# Patient Record
Sex: Female | Born: 1935 | Hispanic: Yes | Smoking: Never smoker
Health system: Southern US, Community
[De-identification: ages and names within clinical notes are randomized; demographics above are authoritative.]

## PROBLEM LIST (undated history)

## (undated) DIAGNOSIS — F0390 Unspecified dementia without behavioral disturbance: Secondary | ICD-10-CM

## (undated) DIAGNOSIS — E119 Type 2 diabetes mellitus without complications: Secondary | ICD-10-CM

## (undated) DIAGNOSIS — E785 Hyperlipidemia, unspecified: Secondary | ICD-10-CM

## (undated) DIAGNOSIS — I639 Cerebral infarction, unspecified: Secondary | ICD-10-CM

## (undated) HISTORY — PX: EYE SURGERY: SHX253

## (undated) HISTORY — PX: CHOLECYSTECTOMY: SHX55

## (undated) HISTORY — PX: ABDOMINAL HYSTERECTOMY: SHX81

---

## 2015-11-23 ENCOUNTER — Other Ambulatory Visit: Payer: Self-pay | Admitting: Specialist

## 2015-11-23 ENCOUNTER — Other Ambulatory Visit (HOSPITAL_COMMUNITY): Payer: Self-pay | Admitting: Specialist

## 2015-11-23 DIAGNOSIS — R519 Headache, unspecified: Secondary | ICD-10-CM

## 2015-11-23 DIAGNOSIS — R42 Dizziness and giddiness: Secondary | ICD-10-CM

## 2015-11-23 DIAGNOSIS — R51 Headache: Secondary | ICD-10-CM

## 2015-11-23 DIAGNOSIS — R413 Other amnesia: Secondary | ICD-10-CM

## 2015-11-29 ENCOUNTER — Encounter (INDEPENDENT_AMBULATORY_CARE_PROVIDER_SITE_OTHER): Payer: Self-pay

## 2015-11-29 ENCOUNTER — Ambulatory Visit
Admission: RE | Admit: 2015-11-29 | Discharge: 2015-11-29 | Disposition: A | Discharge status: Home / Self Care | Payer: Medicare HMO | Source: Ambulatory Visit | Attending: Specialist | Admitting: Specialist

## 2015-11-29 DIAGNOSIS — R413 Other amnesia: Secondary | ICD-10-CM | POA: Insufficient documentation

## 2015-11-29 DIAGNOSIS — R42 Dizziness and giddiness: Secondary | ICD-10-CM | POA: Diagnosis not present

## 2015-11-29 DIAGNOSIS — R519 Headache, unspecified: Secondary | ICD-10-CM

## 2015-11-29 DIAGNOSIS — R51 Headache: Secondary | ICD-10-CM | POA: Diagnosis not present

## 2015-11-29 DIAGNOSIS — G319 Degenerative disease of nervous system, unspecified: Secondary | ICD-10-CM | POA: Insufficient documentation

## 2016-01-27 ENCOUNTER — Encounter: Payer: Self-pay | Admitting: Emergency Medicine

## 2016-01-27 ENCOUNTER — Emergency Department: Payer: Medicare HMO

## 2016-01-27 ENCOUNTER — Emergency Department
Admission: EM | Admit: 2016-01-27 | Discharge: 2016-01-27 | Disposition: A | Discharge status: Home / Self Care | Payer: Medicare HMO | Attending: Student in an Organized Health Care Education/Training Program | Admitting: Student in an Organized Health Care Education/Training Program

## 2016-01-27 DIAGNOSIS — E11649 Type 2 diabetes mellitus with hypoglycemia without coma: Secondary | ICD-10-CM | POA: Diagnosis present

## 2016-01-27 DIAGNOSIS — E162 Hypoglycemia, unspecified: Secondary | ICD-10-CM

## 2016-01-27 DIAGNOSIS — Z79899 Other long term (current) drug therapy: Secondary | ICD-10-CM | POA: Insufficient documentation

## 2016-01-27 DIAGNOSIS — Z7982 Long term (current) use of aspirin: Secondary | ICD-10-CM | POA: Diagnosis not present

## 2016-01-27 DIAGNOSIS — R4182 Altered mental status, unspecified: Secondary | ICD-10-CM | POA: Diagnosis not present

## 2016-01-27 HISTORY — DX: Type 2 diabetes mellitus without complications: E11.9

## 2016-01-27 HISTORY — DX: Hyperlipidemia, unspecified: E78.5

## 2016-01-27 HISTORY — DX: Unspecified dementia, unspecified severity, without behavioral disturbance, psychotic disturbance, mood disturbance, and anxiety: F03.90

## 2016-01-27 LAB — CBC WITH DIFFERENTIAL/PLATELET
BASOS ABS: 0 10*3/uL (ref 0–0.1)
Basophils Relative: 0 %
EOS PCT: 0 %
Eosinophils Absolute: 0 10*3/uL (ref 0–0.7)
HCT: 36.7 % (ref 35.0–47.0)
Hemoglobin: 11.9 g/dL — ABNORMAL LOW (ref 12.0–16.0)
LYMPHS PCT: 18 %
Lymphs Abs: 1.5 10*3/uL (ref 1.0–3.6)
MCH: 26.5 pg (ref 26.0–34.0)
MCHC: 32.5 g/dL (ref 32.0–36.0)
MCV: 81.8 fL (ref 80.0–100.0)
MONO ABS: 0.4 10*3/uL (ref 0.2–0.9)
Monocytes Relative: 5 %
Neutro Abs: 6.4 10*3/uL (ref 1.4–6.5)
Neutrophils Relative %: 77 %
PLATELETS: 207 10*3/uL (ref 150–440)
RBC: 4.48 MIL/uL (ref 3.80–5.20)
RDW: 14.2 % (ref 11.5–14.5)
WBC: 8.3 10*3/uL (ref 3.6–11.0)

## 2016-01-27 LAB — BASIC METABOLIC PANEL
ANION GAP: 10 (ref 5–15)
BUN: 26 mg/dL — ABNORMAL HIGH (ref 6–20)
CALCIUM: 9.1 mg/dL (ref 8.9–10.3)
CO2: 27 mmol/L (ref 22–32)
Chloride: 102 mmol/L (ref 101–111)
Creatinine, Ser: 0.82 mg/dL (ref 0.44–1.00)
Glucose, Bld: 45 mg/dL — ABNORMAL LOW (ref 65–99)
POTASSIUM: 3.6 mmol/L (ref 3.5–5.1)
SODIUM: 139 mmol/L (ref 135–145)

## 2016-01-27 LAB — GLUCOSE, CAPILLARY
GLUCOSE-CAPILLARY: 30 mg/dL — AB (ref 65–99)
GLUCOSE-CAPILLARY: 116 mg/dL — AB (ref 65–99)
Glucose-Capillary: 122 mg/dL — ABNORMAL HIGH (ref 65–99)
Glucose-Capillary: 164 mg/dL — ABNORMAL HIGH (ref 65–99)

## 2016-01-27 LAB — URINALYSIS, COMPLETE (UACMP) WITH MICROSCOPIC
BILIRUBIN URINE: NEGATIVE
Bacteria, UA: NONE SEEN
HGB URINE DIPSTICK: NEGATIVE
Ketones, ur: NEGATIVE mg/dL
Leukocytes, UA: NEGATIVE
Nitrite: NEGATIVE
PH: 5 (ref 5.0–8.0)
PROTEIN: NEGATIVE mg/dL
Specific Gravity, Urine: 1.011 (ref 1.005–1.030)

## 2016-01-27 MED ORDER — DEXTROSE 50 % IV SOLN
1.0000 | Freq: Once | INTRAVENOUS | Status: AC
  Administered 2016-01-27: 50 mL via INTRAVENOUS

## 2016-01-27 NOTE — ED Provider Notes (Signed)
[facility] Emergency Department Provider Note    First MD Initiated Contact with Patient 01/27/16 1440     (approximate)  I have reviewed the triage vital signs and the nursing notes.   HISTORY  Chief Complaint Hypoglycemia    HPI Eileen Mahoney is a 83 y.o. female the history of dementia as well as diabetes on long-acting insulin presents with altered mental status.  Family states that she had a normal appetite. They recently increased her insulin from 20 in the morning and 13 at night to 24 in the morning and 13 at night. They made this change 2 days ago. Denies any other symptoms or past 2 days. States that there were being evaluated today and noted that the patient became very confused had facial droop and was stumbling from side to side. She did not fully lose consciousness. Did not hit her head. They brought her to the ER where her second glucose showed that it was 30. She was given D50 and oral glucose with improvement in her symptoms. Family states that they have never noticed these deficits before. Does have a history of vascular dementia.    Past Medical History:  Diagnosis Date  . Dementia   . Diabetes mellitus without complication (HCC)   . Hyperlipidemia    No family history on file. Past Surgical History:  Procedure Laterality Date  . ABDOMINAL HYSTERECTOMY    . CHOLECYSTECTOMY    . EYE SURGERY     There are no active problems to display for this patient.     Prior to Admission medications   Not on File    Allergies Patient has no known allergies.    Social History Social History  Substance Use Topics  . Smoking status: Never Smoker  . Smokeless tobacco: Never Used  . Alcohol use No    Review of Systems Patient denies headaches, rhinorrhea, blurry vision, numbness, shortness of breath, chest pain, edema, cough, abdominal pain, nausea, vomiting, diarrhea, dysuria, fevers, rashes or hallucinations unless otherwise  stated above in HPI. ____________________________________________   PHYSICAL EXAM:  VITAL SIGNS: Vitals:   01/27/16 1440 01/27/16 1500  BP: (!) 144/64 (!) 154/62  Pulse: 75 75  Resp: 16 13  Temp: 97.9 F (36.6 C)     Constitutional: Alert and oriented. pleasant appearing and in no acute distress. Eyes: Conjunctivae are normal. PERRL. EOMI. Head: Atraumatic. Nose: No congestion/rhinnorhea. Mouth/Throat: Mucous membranes are moist.  Oropharynx non-erythematous. Neck: No stridor. Painless ROM. No cervical spine tenderness to palpation Hematological/Lymphatic/Immunilogical: No cervical lymphadenopathy. Cardiovascular: Normal rate, regular rhythm. Grossly normal heart sounds.  Good peripheral circulation. Respiratory: Normal respiratory effort.  No retractions. Lungs CTAB. Gastrointestinal: Soft and nontender. No distention. No abdominal bruits. No CVA tenderness. Musculoskeletal: No lower extremity tenderness nor edema.  No joint effusions. Neurologic:  CN- intact.  No facial droop, Normal FNF.  Normal heel to shin.  Sensation intact bilaterally. Normal speech and language. No gross focal neurologic deficits are appreciated. No gait instability.  Skin:  Skin is warm, dry and intact. No rash noted. Psychiatric: Mood and affect are normal. Speech and behavior are normal.  ____________________________________________   LABS (all labs ordered are listed, but only abnormal results are displayed)  Results for orders placed or performed during the hospital encounter of 01/27/16 (from the past 24 hour(s))  Glucose, capillary     Status: Abnormal   Collection Time: 01/27/16  2:24 PM  Result Value Ref Range   Glucose-Capillary 30 (LL)  65 - 99 mg/dL   Comment 1 Notify RN   CBC with Differential     Status: Abnormal   Collection Time: 01/27/16  2:38 PM  Result Value Ref Range   WBC 8.3 3.6 - 11.0 K/uL   RBC 4.48 3.80 - 5.20 MIL/uL   Hemoglobin 11.9 (L) 12.0 - 16.0 g/dL   HCT 19.136.7  47.835.0 - 29.547.0 %   MCV 81.8 80.0 - 100.0 fL   MCH 26.5 26.0 - 34.0 pg   MCHC 32.5 32.0 - 36.0 g/dL   RDW 62.114.2 30.811.5 - 65.714.5 %   Platelets 207 150 - 440 K/uL   Neutrophils Relative % 77 %   Neutro Abs 6.4 1.4 - 6.5 K/uL   Lymphocytes Relative 18 %   Lymphs Abs 1.5 1.0 - 3.6 K/uL   Monocytes Relative 5 %   Monocytes Absolute 0.4 0.2 - 0.9 K/uL   Eosinophils Relative 0 %   Eosinophils Absolute 0.0 0 - 0.7 K/uL   Basophils Relative 0 %   Basophils Absolute 0.0 0 - 0.1 K/uL  Basic metabolic panel     Status: Abnormal   Collection Time: 01/27/16  2:38 PM  Result Value Ref Range   Sodium 139 135 - 145 mmol/L   Potassium 3.6 3.5 - 5.1 mmol/L   Chloride 102 101 - 111 mmol/L   CO2 27 22 - 32 mmol/L   Glucose, Bld 45 (L) 65 - 99 mg/dL   BUN 26 (H) 6 - 20 mg/dL   Creatinine, Ser 8.460.82 0.44 - 1.00 mg/dL   Calcium 9.1 8.9 - 96.210.3 mg/dL   GFR calc non Af Amer >60 >60 mL/min   GFR calc Af Amer >60 >60 mL/min   Anion gap 10 5 - 15  Glucose, capillary     Status: Abnormal   Collection Time: 01/27/16  2:45 PM  Result Value Ref Range   Glucose-Capillary 122 (H) 65 - 99 mg/dL  Glucose, capillary     Status: Abnormal   Collection Time: 01/27/16  3:48 PM  Result Value Ref Range   Glucose-Capillary 116 (H) 65 - 99 mg/dL  Urinalysis, Complete w Microscopic     Status: Abnormal   Collection Time: 01/27/16  5:06 PM  Result Value Ref Range   Color, Urine YELLOW (A) YELLOW   APPearance CLEAR (A) CLEAR   Specific Gravity, Urine 1.011 1.005 - 1.030   pH 5.0 5.0 - 8.0   Glucose, UA >=500 (A) NEGATIVE mg/dL   Hgb urine dipstick NEGATIVE NEGATIVE   Bilirubin Urine NEGATIVE NEGATIVE   Ketones, ur NEGATIVE NEGATIVE mg/dL   Protein, ur NEGATIVE NEGATIVE mg/dL   Nitrite NEGATIVE NEGATIVE   Leukocytes, UA NEGATIVE NEGATIVE   RBC / HPF 0-5 0 - 5 RBC/hpf   WBC, UA 0-5 0 - 5 WBC/hpf   Bacteria, UA NONE SEEN NONE SEEN   Squamous Epithelial / LPF 0-5 (A) NONE SEEN   Mucous PRESENT   Glucose, capillary      Status: Abnormal   Collection Time: 01/27/16  5:30 PM  Result Value Ref Range   Glucose-Capillary 164 (H) 65 - 99 mg/dL   ____________________________________________  EKG My review and personal interpretation at Time: 14:39   Indication: ams  Rate: 80  Rhythm: sinus Axis: normal Other: limited due arm lead reversal, no ST elevation or depression, normal intervals ____________________________________________  RADIOLOGY  I personally reviewed all radiographic images ordered to evaluate for the above acute complaints and reviewed radiology reports and findings.  These findings were personally discussed with the patient.  Please see medical record for radiology report.  ____________________________________________   PROCEDURES  Procedure(s) performed:  Procedures    Critical Care performed: no ____________________________________________   INITIAL IMPRESSION / ASSESSMENT AND PLAN / ED COURSE  Pertinent labs & imaging results that were available during my care of the patient were reviewed by me and considered in my medical decision making (see chart for details).  DDX: Dehydration, hypoglycemia,  sepsis, pna, uti, hypoglycemia, cva, drug effect, withdrawal, encephalitis   Eileen Mahoney is a 83 y.o. who presents to the ED with altered mental status droop and difficulty ambulating with acute hypoglycemia. Patient afebrile and otherwise hemodynamically stable. Symptoms resolved after D50 and now tolerating oral glucose. Do suspect this is related to recent increase in her NovoLog. Patient H however we will order laboratory assessment evaluate for any underlying infection or renal impairment that could be leading to prolonged insulin effect. Also order CT imaging of the head due to new deficits to evaluate for any underlying lesions or CVA.  Clinical Course as of Jan 28 24  Thu Jan 27, 2016  1633 Informed by the patient's family that her insulin was given at 8:00.  Therefore would expect the insulin to warn off at this point. She remains in no acute distress.  [PR]  1633 CT head with chronic changes. No evidence of acute mass or lesion.  [PR]  1748 Repeat exam with patient well-appearing and in no acute distress.  Patient is acting at baseline. She has passed her half-life on insulin. I discussed patient needing to return to her previous insulin dosing and to follow-up with primary care physician. There is no evidence of infection. Discussed CT imaging with patient.  Patient was able to tolerate PO and was able to ambulate with a steady gait.  Have discussed with the patient and available family all diagnostics and treatments performed thus far and all questions were answered to the best of my ability. The patient demonstrates understanding and agreement with plan.   [PR]    Clinical Course User Index [PR] Willy Eddy, MD     ____________________________________________   FINAL CLINICAL IMPRESSION(S) / ED DIAGNOSES  Final diagnoses:  Hypoglycemia      NEW MEDICATIONS STARTED DURING THIS VISIT:  New Prescriptions   No medications on file     Note:  This document was prepared using Dragon voice recognition software and may include unintentional dictation errors.    Willy Eddy, MD 01/28/16 Eileen Mahoney

## 2016-01-27 NOTE — ED Notes (Signed)
Patient drank 8oz of OJ 

## 2016-01-27 NOTE — Discharge Instructions (Signed)
Regresa a 24u en la manana y 13u en la noche.

## 2016-01-27 NOTE — ED Triage Notes (Signed)
Interpreter being used. Spanish speaking. Patient presents to ED via POV due to family noticing confusion. CBG 30 in triage. 1 episode of vomiting in triage. Patient brought back to room 26. MD notified. D50 given. Patient alert entire duration. Hx of diabetes.

## 2016-01-27 NOTE — ED Notes (Signed)
Patient given peanut butter, graham crackers and 8 oz of OJ. Family instructed to encourage patient to eat/drink. Verbalize understanding.

## 2016-11-02 ENCOUNTER — Emergency Department: Payer: Medicare HMO

## 2016-11-02 ENCOUNTER — Emergency Department
Admission: EM | Admit: 2016-11-02 | Discharge: 2016-11-02 | Disposition: A | Discharge status: Home / Self Care | Payer: Medicare HMO | Attending: Emergency Medicine | Admitting: Emergency Medicine

## 2016-11-02 DIAGNOSIS — Z794 Long term (current) use of insulin: Secondary | ICD-10-CM | POA: Insufficient documentation

## 2016-11-02 DIAGNOSIS — G4489 Other headache syndrome: Secondary | ICD-10-CM | POA: Insufficient documentation

## 2016-11-02 DIAGNOSIS — E119 Type 2 diabetes mellitus without complications: Secondary | ICD-10-CM | POA: Insufficient documentation

## 2016-11-02 DIAGNOSIS — Z8673 Personal history of transient ischemic attack (TIA), and cerebral infarction without residual deficits: Secondary | ICD-10-CM | POA: Insufficient documentation

## 2016-11-02 DIAGNOSIS — F0390 Unspecified dementia without behavioral disturbance: Secondary | ICD-10-CM | POA: Diagnosis not present

## 2016-11-02 DIAGNOSIS — Z7982 Long term (current) use of aspirin: Secondary | ICD-10-CM | POA: Insufficient documentation

## 2016-11-02 DIAGNOSIS — Z79899 Other long term (current) drug therapy: Secondary | ICD-10-CM | POA: Diagnosis not present

## 2016-11-02 DIAGNOSIS — R51 Headache: Secondary | ICD-10-CM | POA: Diagnosis present

## 2016-11-02 HISTORY — DX: Cerebral infarction, unspecified: I63.9

## 2016-11-02 LAB — CBC
HCT: 42.6 % (ref 35.0–47.0)
Hemoglobin: 14 g/dL (ref 12.0–16.0)
MCH: 27 pg (ref 26.0–34.0)
MCHC: 32.9 g/dL (ref 32.0–36.0)
MCV: 82.3 fL (ref 80.0–100.0)
PLATELETS: 168 10*3/uL (ref 150–440)
RBC: 5.17 MIL/uL (ref 3.80–5.20)
RDW: 14.3 % (ref 11.5–14.5)
WBC: 6.8 10*3/uL (ref 3.6–11.0)

## 2016-11-02 LAB — BASIC METABOLIC PANEL
ANION GAP: 9 (ref 5–15)
BUN: 18 mg/dL (ref 6–20)
CALCIUM: 9.2 mg/dL (ref 8.9–10.3)
CO2: 28 mmol/L (ref 22–32)
CREATININE: 0.97 mg/dL (ref 0.44–1.00)
Chloride: 103 mmol/L (ref 101–111)
GFR calc Af Amer: >60 mL/min (ref >60)
GFR, EST NON AFRICAN AMERICAN: 52 mL/min — AB (ref >60)
GLUCOSE: 155 mg/dL — AB (ref 65–99)
Potassium: 4.3 mmol/L (ref 3.5–5.1)
Sodium: 140 mmol/L (ref 135–145)

## 2016-11-02 LAB — HEPATIC FUNCTION PANEL
ALBUMIN: 4.1 g/dL (ref 3.5–5.0)
ALT: 18 U/L (ref 14–54)
AST: 35 U/L (ref 15–41)
Alkaline Phosphatase: 68 U/L (ref 38–126)
Bilirubin, Direct: 0.1 mg/dL (ref 0.1–0.5)
Indirect Bilirubin: 0.6 mg/dL (ref 0.3–0.9)
TOTAL PROTEIN: 8.7 g/dL — AB (ref 6.5–8.1)
Total Bilirubin: 0.7 mg/dL (ref 0.3–1.2)

## 2016-11-02 LAB — URINALYSIS, COMPLETE (UACMP) WITH MICROSCOPIC
Bilirubin Urine: NEGATIVE
GLUCOSE, UA: NEGATIVE mg/dL
Hgb urine dipstick: NEGATIVE
Ketones, ur: 5 mg/dL — AB
NITRITE: NEGATIVE
PH: 6 (ref 5.0–8.0)
Protein, ur: 30 mg/dL — AB
Specific Gravity, Urine: 1.015 (ref 1.005–1.030)

## 2016-11-02 LAB — TROPONIN I: Troponin I: <0.03 ng/mL (ref <0.03)

## 2016-11-02 LAB — LIPASE, BLOOD: Lipase: 21 U/L (ref 11–51)

## 2016-11-02 MED ORDER — ACETAMINOPHEN 500 MG PO TABS
1000.0000 mg | ORAL_TABLET | Freq: Once | ORAL | Status: AC
  Administered 2016-11-02: 1000 mg via ORAL
  Filled 2016-11-02: qty 2

## 2016-11-02 MED ORDER — IBUPROFEN 600 MG PO TABS
600.0000 mg | ORAL_TABLET | Freq: Once | ORAL | Status: AC
  Administered 2016-11-02: 600 mg via ORAL
  Filled 2016-11-02: qty 1

## 2016-11-02 NOTE — ED Notes (Signed)
First Nurse Note:  Patient presents to the ED with nausea, vomiting and confusion.  Patient is spanish speaking.  Daughter reports complaint.  Daughter states patient has recently been started on a new medication and these are possible side effects.

## 2016-11-02 NOTE — ED Triage Notes (Addendum)
Per patient's family: patient has a history of stroke and dementia. Patient dose of  Donepezil increased today from 5mg  to 10 mg today. Patient c/o headache, dizziness, increasing confusion, weakness, anxiety, and emesis.

## 2016-11-02 NOTE — Discharge Instructions (Signed)
Please take 600 mg of ibuprofen or 1000 mg of Tylenol as needed for headache and follow-up with your primary care physician this coming week for reexamination. Return to the emergency department sooner for any concerns whatsoever.  It was a pleasure to take care of you today, and thank you for coming to our emergency department.  If you have any questions or concerns before leaving please ask the nurse to grab me and I'm more than happy to go through your aftercare instructions again.  If you were prescribed any opioid pain medication today such as Norco, Vicodin, Percocet, morphine, hydrocodone, or oxycodone please make sure you do not drive when you are taking this medication as it can alter your ability to drive safely.  If you have any concerns once you are home that you are not improving or are in fact getting worse before you can make it to your follow-up appointment, please do not hesitate to call 911 and come back for further evaluation.  Merrily Brittle, MD  Results for orders placed or performed during the hospital encounter of 11/02/16  Basic metabolic panel  Result Value Ref Range   Sodium 140 135 - 145 mmol/L   Potassium 4.3 3.5 - 5.1 mmol/L   Chloride 103 101 - 111 mmol/L   CO2 28 22 - 32 mmol/L   Glucose, Bld 155 (H) 65 - 99 mg/dL   BUN 18 6 - 20 mg/dL   Creatinine, Ser 4.09 0.44 - 1.00 mg/dL   Calcium 9.2 8.9 - 81.1 mg/dL   GFR calc non Af Amer 52 (L) >60 mL/min   GFR calc Af Amer >60 >60 mL/min   Anion gap 9 5 - 15  CBC  Result Value Ref Range   WBC 6.8 3.6 - 11.0 K/uL   RBC 5.17 3.80 - 5.20 MIL/uL   Hemoglobin 14.0 12.0 - 16.0 g/dL   HCT 91.4 78.2 - 95.6 %   MCV 82.3 80.0 - 100.0 fL   MCH 27.0 26.0 - 34.0 pg   MCHC 32.9 32.0 - 36.0 g/dL   RDW 21.3 08.6 - 57.8 %   Platelets 168 150 - 440 K/uL  Urinalysis, Complete w Microscopic  Result Value Ref Range   Color, Urine YELLOW (A) YELLOW   APPearance CLEAR (A) CLEAR   Specific Gravity, Urine 1.015 1.005 - 1.030   pH 6.0 5.0 - 8.0   Glucose, UA NEGATIVE NEGATIVE mg/dL   Hgb urine dipstick NEGATIVE NEGATIVE   Bilirubin Urine NEGATIVE NEGATIVE   Ketones, ur 5 (A) NEGATIVE mg/dL   Protein, ur 30 (A) NEGATIVE mg/dL   Nitrite NEGATIVE NEGATIVE   Leukocytes, UA TRACE (A) NEGATIVE   RBC / HPF 0-5 0 - 5 RBC/hpf   WBC, UA 0-5 0 - 5 WBC/hpf   Bacteria, UA RARE (A) NONE SEEN   Squamous Epithelial / LPF 0-5 (A) NONE SEEN   Mucus PRESENT   Troponin I  Result Value Ref Range   Troponin I <0.03 <0.03 ng/mL  Lipase, blood  Result Value Ref Range   Lipase 21 11 - 51 U/L  Hepatic function panel  Result Value Ref Range   Total Protein 8.7 (H) 6.5 - 8.1 g/dL   Albumin 4.1 3.5 - 5.0 g/dL   AST 35 15 - 41 U/L   ALT 18 14 - 54 U/L   Alkaline Phosphatase 68 38 - 126 U/L   Total Bilirubin 0.7 0.3 - 1.2 mg/dL   Bilirubin, Direct 0.1 0.1 - 0.5 mg/dL  Indirect Bilirubin 0.6 0.3 - 0.9 mg/dL   Ct Head Wo Contrast  Result Date: 11/02/2016 CLINICAL DATA:  Confusion, weakness, vomiting, headache today. History of stroke, dementia, hyperlipidemia and diabetes. EXAM: CT HEAD WITHOUT CONTRAST TECHNIQUE: Contiguous axial images were obtained from the base of the skull through the vertex without intravenous contrast. COMPARISON:  CT HEAD January 27, 2016 FINDINGS: BRAIN: No intraparenchymal hemorrhage, mass effect nor midline shift. The ventricles and sulci are normal for age. Patchy supratentorial white matter hypodensities less than expected for patient's age, though non-specific are most compatible with chronic small vessel ischemic disease. No acute large vascular territory infarcts. No abnormal extra-axial fluid collections. Basal cisterns are patent. VASCULAR: Moderate calcific atherosclerosis of the carotid siphons. SKULL: No skull fracture. No significant scalp soft tissue swelling. SINUSES/ORBITS: The mastoid air-cells and included paranasal sinuses are well-aerated.The included ocular globes and orbital contents are  non-suspicious. Status post bilateral ocular lens implants. OTHER: None. IMPRESSION: Stable negative noncontrast CT HEAD for age. Electronically Signed   By: Awilda Metroourtnay  Bloomer M.D.   On: 11/02/2016 20:21

## 2016-11-02 NOTE — ED Provider Notes (Signed)
[facility] Emergency Department Provider Note  ____________________________________________   First MD Initiated Contact with Patient 11/02/16 2111     (approximate)  I have reviewed the triage vital signs and the nursing notes.   HISTORY  Chief Complaint Emesis; Headache; and Dizziness  level V exemption history Limited by the patient's dementia  HPI Eileen Mahoney is a 83 y.o. female is brought to the emergency department by family for pressure-like headache that began this morning when the patient awoke. The patient has a history of dementia and normally takes Aricept 5 mg daily however today began taking 10 mg for the first time. Her headache is difficult to describe but described as pressure-like constant throbbing aching and unlike any headache she's ever had before. She's had some nausea but has not vomited. Some mild abdominal discomfort. Her headache is nonradiating. Nothing in particular seems to make it better or worse. No fevers or chills.   Past Medical History:  Diagnosis Date  . Dementia   . Diabetes mellitus without complication (HCC)   . Hyperlipidemia   . Stroke [facility](HCC)     There are no active problems to display for this patient.   Past Surgical History:  Procedure Laterality Date  . ABDOMINAL HYSTERECTOMY    . CHOLECYSTECTOMY    . EYE SURGERY      Prior to Admission medications   Medication Sig Start Date End Date Taking? Authorizing Provider  aspirin EC 81 MG tablet Take 81 mg by mouth daily.    [provider]  Cholecalciferol (VITAMIN D3) 2000 units TABS Take 1 tablet by mouth daily with breakfast.    [provider]  donepezil (ARICEPT) 5 MG tablet Take 5 mg by mouth daily with breakfast. 01/27/16   [provider]  NOVOLOG MIX 70/30 FLEXPEN (70-30) 100 UNIT/ML FlexPen INJECT [place] 24 UNITS WITH BREAKFAST  AND 13 UNITS WITH DINNER OR AS DIRECTED BY ENDO NURSE 12/17/15   [provider]    pravastatin (PRAVACHOL) 40 MG tablet Take 40 mg by mouth at bedtime. 11/28/15   [provider]  QUEtiapine (SEROQUEL) 25 MG tablet Take 25 mg by mouth daily. 12/30/15   [provider]    Allergies Patient has no known allergies.  No family history on file.  Social History Social History  Substance Use Topics  . Smoking status: Never Smoker  . Smokeless tobacco: Never Used  . Alcohol use No    Review of Systems level V exemption history Limited by the patient's dementia ____________________________________________   PHYSICAL EXAM:  VITAL SIGNS: ED Triage Vitals  Enc Vitals Group     BP 11/02/16 1915 (!) 150/59     Pulse Rate 11/02/16 1915 67     Resp 11/02/16 1915 18     Temp 11/02/16 1915 97.8 F (36.6 C)     Temp Source 11/02/16 1915 Oral     SpO2 11/02/16 1915 99 %     Weight 11/02/16 1902 162 lb (73.5 kg)     Height 11/02/16 1902 5' (1.524 m)     Head Circumference --      Peak Flow --      Pain Score --      Pain Loc --      Pain Edu? --      Excl. in GC? --     Constitutional: pleasant cooperative no distress Eyes: PERRL EOMI. pupils midrange and brisk Head: Atraumatic. Nose: No congestion/rhinnorhea. Mouth/Throat: No trismus Neck: No  stridor.  no meningismus Cardiovascular: Normal rate, regular rhythm. Grossly normal heart sounds.  Good peripheral circulation. Respiratory: Normal respiratory effort.  No retractions. Lungs CTAB and moving good air Gastrointestinal: soft nontender Musculoskeletal: No lower extremity edema   Neurologic:  Normal speech and language. No gross focal neurologic deficits are appreciated. Skin:  Skin is warm, dry and intact. No rash noted. Psychiatric: Mood and affect are normal. Speech and behavior are normal.    ____________________________________________   DIFFERENTIAL includes but not limited to  intracerebral hemorrhage, stroke, normal pressure hydrocephalus, sepsis, urinary tract  infection ____________________________________________   LABS (all labs ordered are listed, but only abnormal results are displayed)  Labs Reviewed  BASIC METABOLIC PANEL - Abnormal; Notable for the following:       Result Value   Glucose, Bld 155 (*)    GFR calc non Af Amer 52 (*)    All other components within normal limits  URINALYSIS, COMPLETE (UACMP) WITH MICROSCOPIC - Abnormal; Notable for the following:    Color, Urine YELLOW (*)    APPearance CLEAR (*)    Ketones, ur 5 (*)    Protein, ur 30 (*)    Leukocytes, UA TRACE (*)    Bacteria, UA RARE (*)    Squamous Epithelial / LPF 0-5 (*)    All other components within normal limits  HEPATIC FUNCTION PANEL - Abnormal; Notable for the following:    Total Protein 8.7 (*)    All other components within normal limits  CBC  TROPONIN I  LIPASE, BLOOD  CBG MONITORING, ED    blood work reviewed and interpreted by me with no acute disease __________________________________________  EKG  ED ECG REPORT I, Merrily Brittle, the attending physician, personally viewed and interpreted this ECG.  Date: 11/02/2016 EKG Time:  Rate: 68 Rhythm: normal sinus rhythm QRS Axis: normal Intervals: normal ST/T Wave abnormalities: normal Narrative Interpretation:normal sinus rhythm at 68 with left bundle branch block and no signs of acute ischemia  ____________________________________________  RADIOLOGY  head CT reviewed by me with chronic changes but no acute disease ____________________________________________   PROCEDURES  Procedure(s) performed: no  Procedures  Critical Care performed: no  Observation: no ____________________________________________   INITIAL IMPRESSION / ASSESSMENT AND PLAN / ED COURSE  Pertinent labs & imaging results that were available during my care of the patient were reviewed by me and considered in my medical decision making (see chart for details).  The patient arrives hemodynamically stable  very well-appearing although with a new onset headache that she's never had before. CT scan of the head noncontrast is fortunately negative for acute disease. Her symptoms are nearly completely resolved after ibuprofen and Tylenol. She remains neuro intact. No referred back to primary care for further evaluation. The patient and her family verbalized understanding and agreement with the plan.      ____________________________________________   FINAL CLINICAL IMPRESSION(S) / ED DIAGNOSES  Final diagnoses:  Other headache syndrome      NEW MEDICATIONS STARTED DURING THIS VISIT:  Discharge Medication List as of 11/02/2016 10:30 PM       Note:  This document was prepared using Dragon voice recognition software and may include unintentional dictation errors.     Merrily Brittle, MD 11/02/16 [phone]

## 2016-11-03 ENCOUNTER — Emergency Department
Admission: EM | Admit: 2016-11-03 | Discharge: 2016-11-03 | Disposition: A | Discharge status: Home / Self Care | Payer: Medicare HMO | Attending: Emergency Medicine | Admitting: Emergency Medicine

## 2016-11-03 ENCOUNTER — Encounter: Payer: Self-pay | Admitting: Emergency Medicine

## 2016-11-03 DIAGNOSIS — T7840XD Allergy, unspecified, subsequent encounter: Secondary | ICD-10-CM | POA: Insufficient documentation

## 2016-11-03 DIAGNOSIS — T887XXD Unspecified adverse effect of drug or medicament, subsequent encounter: Secondary | ICD-10-CM | POA: Diagnosis not present

## 2016-11-03 DIAGNOSIS — R51 Headache: Secondary | ICD-10-CM | POA: Diagnosis present

## 2016-11-03 DIAGNOSIS — E785 Hyperlipidemia, unspecified: Secondary | ICD-10-CM | POA: Diagnosis not present

## 2016-11-03 DIAGNOSIS — Z794 Long term (current) use of insulin: Secondary | ICD-10-CM | POA: Diagnosis not present

## 2016-11-03 DIAGNOSIS — Y829 Unspecified medical devices associated with adverse incidents: Secondary | ICD-10-CM | POA: Insufficient documentation

## 2016-11-03 DIAGNOSIS — T50905D Adverse effect of unspecified drugs, medicaments and biological substances, subsequent encounter: Secondary | ICD-10-CM | POA: Diagnosis not present

## 2016-11-03 DIAGNOSIS — Z7982 Long term (current) use of aspirin: Secondary | ICD-10-CM | POA: Diagnosis not present

## 2016-11-03 DIAGNOSIS — E119 Type 2 diabetes mellitus without complications: Secondary | ICD-10-CM | POA: Diagnosis not present

## 2016-11-03 DIAGNOSIS — R112 Nausea with vomiting, unspecified: Secondary | ICD-10-CM | POA: Insufficient documentation

## 2016-11-03 DIAGNOSIS — F0390 Unspecified dementia without behavioral disturbance: Secondary | ICD-10-CM | POA: Insufficient documentation

## 2016-11-03 LAB — BASIC METABOLIC PANEL
ANION GAP: 11 (ref 5–15)
BUN: 26 mg/dL — ABNORMAL HIGH (ref 6–20)
CALCIUM: 9.2 mg/dL (ref 8.9–10.3)
CO2: 26 mmol/L (ref 22–32)
Chloride: 102 mmol/L (ref 101–111)
Creatinine, Ser: 1.19 mg/dL — ABNORMAL HIGH (ref 0.44–1.00)
GFR, EST AFRICAN AMERICAN: 47 mL/min — AB (ref >60)
GFR, EST NON AFRICAN AMERICAN: 41 mL/min — AB (ref >60)
GLUCOSE: 241 mg/dL — AB (ref 65–99)
POTASSIUM: 3.8 mmol/L (ref 3.5–5.1)
Sodium: 139 mmol/L (ref 135–145)

## 2016-11-03 LAB — CBC
HEMATOCRIT: 40 % (ref 35.0–47.0)
HEMOGLOBIN: 13.1 g/dL (ref 12.0–16.0)
MCH: 27 pg (ref 26.0–34.0)
MCHC: 32.7 g/dL (ref 32.0–36.0)
MCV: 82.7 fL (ref 80.0–100.0)
Platelets: 156 10*3/uL (ref 150–440)
RBC: 4.83 MIL/uL (ref 3.80–5.20)
RDW: 14.2 % (ref 11.5–14.5)
WBC: 7.3 10*3/uL (ref 3.6–11.0)

## 2016-11-03 LAB — GLUCOSE, CAPILLARY: GLUCOSE-CAPILLARY: 159 mg/dL — AB (ref 65–99)

## 2016-11-03 MED ORDER — DONEPEZIL HCL 5 MG PO TABS: 5.0000 mg | ORAL_TABLET | Freq: Every day | ORAL | 0 refills | Status: AC

## 2016-11-03 NOTE — ED Triage Notes (Signed)
Pt here for n/v, dizziness, confusion, weakness and headache. Pt was seen for same here last night.

## 2016-11-03 NOTE — Discharge Instructions (Signed)
Please decrease your dose of donepezil to 5mg  daily and follow up with your PMD this coming Monday for a recheck.  Return to the ED sooner for any concerns.  It was a pleasure to take care of you today, and thank you for coming to our emergency department.  If you have any questions or concerns before leaving please ask the nurse to grab me and I'm more than happy to go through your aftercare instructions again.  If you were prescribed any opioid pain medication today such as Norco, Vicodin, Percocet, morphine, hydrocodone, or oxycodone please make sure you do not drive when you are taking this medication as it can alter your ability to drive safely.  If you have any concerns once you are home that you are not improving or are in fact getting worse before you can make it to your follow-up appointment, please do not hesitate to call 911 and come back for further evaluation.  Merrily BrittleNeil Burney Calzadilla, MD  Results for orders placed or performed during the hospital encounter of 11/03/16  Basic metabolic panel  Result Value Ref Range   Sodium 139 135 - 145 mmol/L   Potassium 3.8 3.5 - 5.1 mmol/L   Chloride 102 101 - 111 mmol/L   CO2 26 22 - 32 mmol/L   Glucose, Bld 241 (H) 65 - 99 mg/dL   BUN 26 (H) 6 - 20 mg/dL   Creatinine, Ser 0.861.19 (H) 0.44 - 1.00 mg/dL   Calcium 9.2 8.9 - 57.810.3 mg/dL   GFR calc non Af Amer 41 (L) >60 mL/min   GFR calc Af Amer 47 (L) >60 mL/min   Anion gap 11 5 - 15  CBC  Result Value Ref Range   WBC 7.3 3.6 - 11.0 K/uL   RBC 4.83 3.80 - 5.20 MIL/uL   Hemoglobin 13.1 12.0 - 16.0 g/dL   HCT 46.940.0 62.935.0 - 52.847.0 %   MCV 82.7 80.0 - 100.0 fL   MCH 27.0 26.0 - 34.0 pg   MCHC 32.7 32.0 - 36.0 g/dL   RDW 41.314.2 24.411.5 - 01.014.5 %   Platelets 156 150 - 440 K/uL  Glucose, capillary  Result Value Ref Range   Glucose-Capillary 159 (H) 65 - 99 mg/dL   Ct Head Wo Contrast  Result Date: 11/02/2016 CLINICAL DATA:  Confusion, weakness, vomiting, headache today. History of stroke, dementia,  hyperlipidemia and diabetes. EXAM: CT HEAD WITHOUT CONTRAST TECHNIQUE: Contiguous axial images were obtained from the base of the skull through the vertex without intravenous contrast. COMPARISON:  CT HEAD January 27, 2016 FINDINGS: BRAIN: No intraparenchymal hemorrhage, mass effect nor midline shift. The ventricles and sulci are normal for age. Patchy supratentorial white matter hypodensities less than expected for patient's age, though non-specific are most compatible with chronic small vessel ischemic disease. No acute large vascular territory infarcts. No abnormal extra-axial fluid collections. Basal cisterns are patent. VASCULAR: Moderate calcific atherosclerosis of the carotid siphons. SKULL: No skull fracture. No significant scalp soft tissue swelling. SINUSES/ORBITS: The mastoid air-cells and included paranasal sinuses are well-aerated.The included ocular globes and orbital contents are non-suspicious. Status post bilateral ocular lens implants. OTHER: None. IMPRESSION: Stable negative noncontrast CT HEAD for age. Electronically Signed   By: Awilda Metroourtnay  Bloomer M.D.   On: 11/02/2016 20:21

## 2016-11-03 NOTE — ED Provider Notes (Signed)
[facility] Emergency Department Provider Note  ____________________________________________   First MD Initiated Contact with Patient 11/03/16 1714     (approximate)  I have reviewed the triage vital signs and the nursing notes.   HISTORY  Chief Complaint Nausea; Emesis; and Dizziness   HPI Joanette GulaRosalia Ayala Roldan is a 83 y.o. female his brought to the emergency department by her family for a brief episode today of pressure-like moderate severity throbbing headache nausea and several episodes of vomiting as well as shaking. She also had increasing confusion for roughly 1 hour. I'm very familiar with the patient is actually saw her and treated her yesterday when she had normal blood work, normal urinalysis, and a normal head CT. The family attributed to change in her symptoms to an increasing dosage for Aricept. I advised the family to continue her home dose of Aricept but to follow up with primary care physician today. This morning they gave her 10 mg of Aricept and her symptoms began shortly thereafter. They were unable to get an appointment to see the primary care doctor said they came to the emergency department instead. The patient is currently behaving normally.   Past Medical History:  Diagnosis Date  . Dementia   . Diabetes mellitus without complication (HCC)   . Hyperlipidemia   . Stroke [facility](HCC)     There are no active problems to display for this patient.   Past Surgical History:  Procedure Laterality Date  . ABDOMINAL HYSTERECTOMY    . CHOLECYSTECTOMY    . EYE SURGERY      Prior to Admission medications   Medication Sig Start Date End Date Taking? Authorizing Provider  aspirin EC 81 MG tablet Take 81 mg by mouth daily.    [provider]  Cholecalciferol (VITAMIN D3) 2000 units TABS Take 1 tablet by mouth daily with breakfast.    [provider]  donepezil (ARICEPT) 5 MG tablet Take 1 tablet (5 mg total) by mouth daily with  breakfast. 11/03/16   Krystianna Soth, Lloyd HugerNeil, MD  NOVOLOG MIX 70/30 FLEXPEN (70-30) 100 UNIT/ML FlexPen INJECT [place] 24 UNITS WITH BREAKFAST  AND 13 UNITS WITH DINNER OR AS DIRECTED BY ENDO NURSE 12/17/15   [provider]  pravastatin (PRAVACHOL) 40 MG tablet Take 40 mg by mouth at bedtime. 11/28/15   [provider]  QUEtiapine (SEROQUEL) 25 MG tablet Take 25 mg by mouth daily. 12/30/15   [provider]    Allergies Patient has no known allergies.  No family history on file.  Social History Social History  Substance Use Topics  . Smoking status: Never Smoker  . Smokeless tobacco: Never Used  . Alcohol use No    Review of Systems Constitutional: No fever/chills Eyes: No visual changes. ENT: No sore throat. Cardiovascular: Denies chest pain. Respiratory: Denies shortness of breath. Gastrointestinal: positive for abdominal pain.  positive for nausea, positive for vomiting.  No diarrhea.  No constipation. Genitourinary: Negative for dysuria. Musculoskeletal: Negative for back pain. Skin: Negative for rash. Neurological: positive for headache   ____________________________________________   PHYSICAL EXAM:  VITAL SIGNS: ED Triage Vitals  Enc Vitals Group     BP      Pulse      Resp      Temp      Temp src      SpO2      Weight      Height      Head Circumference      Peak  Flow      Pain Score      Pain Loc      Pain Edu?      Excl. in GC?     Constitutional: alert and oriented 4 joking and laughing well appearing nontoxic no diaphoresis speaks full clear sentences Eyes: PERRL EOMI. Head: Atraumatic. Nose: No congestion/rhinnorhea. Mouth/Throat: No trismus Neck: No stridor.   Cardiovascular: Normal rate, regular rhythm. Grossly normal heart sounds.  Good peripheral circulation. Respiratory: Normal respiratory effort.  No retractions. Lungs CTAB and moving good air Gastrointestinal: soft nontender Musculoskeletal: No lower extremity edema     Neurologic:  Normal speech and language. No gross focal neurologic deficits are appreciated. Skin:  Skin is warm, dry and intact. No rash noted. Psychiatric: mild to moderate dementia    ____________________________________________   DIFFERENTIAL includes but not limited to  adverse reaction to medication, but anabolic derangement, sepsis ____________________________________________   LABS (all labs ordered are listed, but only abnormal results are displayed)  Labs Reviewed  BASIC METABOLIC PANEL - Abnormal; Notable for the following:       Result Value   Glucose, Bld 241 (*)    BUN 26 (*)    Creatinine, Ser 1.19 (*)    GFR calc non Af Amer 41 (*)    GFR calc Af Amer 47 (*)    All other components within normal limits  GLUCOSE, CAPILLARY - Abnormal; Notable for the following:    Glucose-Capillary 159 (*)    All other components within normal limits  CBC  CBG MONITORING, ED  CBG MONITORING, ED    blood work revealing interpreted by me as no acute disease __________________________________________  EKG  ED ECG REPORT I, Merrily Brittle, the attending physician, personally viewed and interpreted this ECG.  Date: 11/03/2016 EKG Time:  Rate: 62 Rhythm: normal sinus rhythm QRS Axis: leftward Intervals: normal ST/T Wave abnormalities: normal Narrative Interpretation: normal sinus rhythm at 62 with left bundle branch block and no signs of acute ischemia unchanged from EKG yesterday  ____________________________________________  RADIOLOGY   ____________________________________________   PROCEDURES  Procedure(s) performed: no  Procedures  Critical Care performed: no  Observation: no ____________________________________________   INITIAL IMPRESSION / ASSESSMENT AND PLAN / ED COURSE  Pertinent labs & imaging results that were available during my care of the patient were reviewed by me and considered in my medical decision making (see chart for  details).  Thomas on the patient she was completely back to her baseline and behaving normally. There does seem to be a temporal relationship with an increased dose of Aricept and the patient's abnormal behavior. Family no one longer has prescription for 5 mg tablets so I will give her a 1 month supply and refer her back to primary care. Strict return precautions were given and the patient and family verbalized understanding and agreement with plan.      ____________________________________________   FINAL CLINICAL IMPRESSION(S) / ED DIAGNOSES  Final diagnoses:  Adverse effect of drug, subsequent encounter      NEW MEDICATIONS STARTED DURING THIS VISIT:  Discharge Medication List as of 11/03/2016  5:39 PM       Note:  This document was prepared using Dragon voice recognition software and may include unintentional dictation errors.     Merrily Brittle, MD 11/03/16 2329

## 2017-10-19 ENCOUNTER — Ambulatory Visit (INDEPENDENT_AMBULATORY_CARE_PROVIDER_SITE_OTHER): Payer: Medicare HMO

## 2017-10-19 ENCOUNTER — Ambulatory Visit
Admission: EM | Admit: 2017-10-19 | Discharge: 2017-10-19 | Disposition: A | Discharge status: Home / Self Care | Payer: Medicare HMO | Attending: Family Medicine | Admitting: Family Medicine

## 2017-10-19 ENCOUNTER — Other Ambulatory Visit: Payer: Self-pay

## 2017-10-19 ENCOUNTER — Encounter: Payer: Self-pay | Admitting: Emergency Medicine

## 2017-10-19 DIAGNOSIS — E119 Type 2 diabetes mellitus without complications: Secondary | ICD-10-CM

## 2017-10-19 DIAGNOSIS — S90821A Blister (nonthermal), right foot, initial encounter: Secondary | ICD-10-CM | POA: Diagnosis not present

## 2017-10-19 DIAGNOSIS — M79674 Pain in right toe(s): Secondary | ICD-10-CM

## 2017-10-19 DIAGNOSIS — T148XXA Other injury of unspecified body region, initial encounter: Secondary | ICD-10-CM

## 2017-10-19 MED ORDER — MUPIROCIN 2 % EX OINT: 1.0000 "application " | TOPICAL_OINTMENT | Freq: Three times a day (TID) | CUTANEOUS | 0 refills | Status: AC

## 2017-10-19 NOTE — Discharge Instructions (Signed)
Apply mupirocin ointment 3 times daily to the area.  Avoid any extra pressure on the area.  If the pain increases or she has more swelling of the toe, if it becomes red and hot ,or if she has red streaks up her foot or leg ,return to our clinic immediately or go to the emergency room

## 2017-10-19 NOTE — ED Provider Notes (Signed)
[facility]    CSN: [id number] Arrival date & time: 10/19/17  1022     History   Chief Complaint No chief complaint on file.   HPI Eileen Mahoney is a 84 y.o. female.   HPI  84 year old female presents with her daughter is  translator for her mom who speaks only [place].  States that she has  pain in her l right great toe on the plantar surface.  Concerned this may be an infection and was worried because of her mother's diabetes.  Not remember any specific injury but does state that she has a tendency of shuffling her feet on floors in the house which may have caused it.  No fever or chills has had no drainage from the area.        Past Medical History:  Diagnosis Date  . Dementia (HCC)   . Diabetes mellitus without complication (HCC)   . Hyperlipidemia   . Stroke [facility])     There are no active problems to display for this patient.   Past Surgical History:  Procedure Laterality Date  . ABDOMINAL HYSTERECTOMY    . CHOLECYSTECTOMY    . EYE SURGERY      OB History   None      Home Medications    Prior to Admission medications   Medication Sig Start Date End Date Taking? Authorizing Provider  aspirin EC 81 MG tablet Take 81 mg by mouth daily.   Yes [provider]  Calcium Carb-Ergocalciferol 500-200 MG-UNIT TABS Take by mouth.   Yes [provider]  denosumab (PROLIA) 60 MG/ML SOLN injection Prolia 60 mg/mL subcutaneous syringe   Yes [provider]  donepezil (ARICEPT) 5 MG tablet Take 1 tablet (5 mg total) by mouth daily with breakfast. 11/03/16  Yes Rifenbark, Lloyd Huger, MD  NOVOLOG MIX 70/30 FLEXPEN (70-30) 100 UNIT/ML FlexPen INJECT [place] 24 UNITS WITH BREAKFAST  AND 13 UNITS WITH DINNER OR AS DIRECTED BY ENDO NURSE 12/17/15  Yes [provider]  pravastatin (PRAVACHOL) 40 MG tablet Take 40 mg by mouth at bedtime. 11/28/15  Yes [provider]  QUEtiapine (SEROQUEL) 25 MG tablet Take 25 mg by mouth  daily. 12/30/15  Yes [provider]  Cholecalciferol (VITAMIN D3) 2000 units TABS Take 1 tablet by mouth daily with breakfast.    [provider]  mupirocin ointment (BACTROBAN) 2 % Apply 1 application topically 3 (three) times daily. 10/19/17   Lutricia Feil, PA-C    Family History History reviewed. No pertinent family history.  Social History Social History   Tobacco Use  . Smoking status: Never Smoker  . Smokeless tobacco: Never Used  Substance Use Topics  . Alcohol use: No  . Drug use: Never     Allergies   Patient has no known allergies.   Review of Systems Review of Systems  Constitutional: Positive for activity change. Negative for appetite change, chills, fatigue and fever.  Musculoskeletal: Positive for gait problem.  Skin: Positive for color change.  All other systems reviewed and are negative.    Physical Exam Triage Vital Signs ED Triage Vitals  Enc Vitals Group     BP 10/19/17 1150 104/77     Pulse Rate 10/19/17 1150 89     Resp 10/19/17 1150 17     Temp 10/19/17 1150 98.6 F (37 C)     Temp Source 10/19/17 1150 Oral     SpO2 10/19/17 1150 99 %     Weight  10/19/17 1039 156 lb (70.8 kg)     Height 10/19/17 1039 5' (1.524 m)     Head Circumference --      Peak Flow --      Pain Score 10/19/17 1154 0     Pain Loc --      Pain Edu? --      Excl. in GC? --    No data found.  Updated Vital Signs BP 104/77 (BP Location: Left Arm)   Pulse 89   Temp 98.6 F (37 C) (Oral)   Resp 17   Ht 5' (1.524 m)   Wt 156 lb (70.8 kg)   SpO2 99%   BMI 30.47 kg/m   Visual Acuity Right Eye Distance:   Left Eye Distance:   Bilateral Distance:    Right Eye Near:   Left Eye Near:    Bilateral Near:     Physical Exam  Constitutional: She is oriented to person, place, and time. She appears well-developed and well-nourished. No distress.  HENT:  Head: Normocephalic.  Eyes: Pupils are equal, round, and reactive to light. Right eye  exhibits no discharge. Left eye exhibits no discharge.  Neck: Normal range of motion.  Musculoskeletal: Normal range of motion. She exhibits edema and tenderness.  Neurological: She is alert and oriented to person, place, and time.  Skin: Skin is warm and dry. She is not diaphoretic. There is erythema.  Examination of the right foot on the plantar surface of the great toe shows a small blister 3 mm in diameter erythematous base that has apparently mild necrotic area circumferentially.  There is no induration present there is no fluctuance but the blister does have softness over center portion.  She has no ascending lymphangitis present.  Psychiatric: She has a normal mood and affect. Her behavior is normal. Judgment and thought content normal.  Nursing note and vitals reviewed.    UC Treatments / Results  Labs (all labs ordered are listed, but only abnormal results are displayed) Labs Reviewed - No data to display  EKG None  Radiology Dg Toe Great Right  Result Date: 10/19/2017 CLINICAL DATA:  Pain and blood blister at distal RIGHT great toe, dementia, no recalled injury EXAM: RIGHT GREAT TOE COMPARISON:  None FINDINGS: Osseous demineralization. Joint spaces preserved. No acute fracture, dislocation, or bone destruction. Scattered small vessel vascular calcifications. IMPRESSION: No acute osseous abnormalities. Electronically Signed   By: Ulyses Southward M.D.   On: 10/19/2017 11:25    Procedures Procedures (including critical care time)  Medications Ordered in UC Medications - No data to display  Initial Impression / Assessment and Plan / UC Course  I have reviewed the triage vital signs and the nursing notes.  Pertinent labs & imaging results that were available during my care of the patient were reviewed by me and considered in my medical decision making (see chart for details).    She was also seen and examined by Dr. Judd Gaudier who provided additional translation services.  She should  avoid excessive pressure on the area.  Protect this area from further trauma.  Provide her with Bactroban ointment to prevent any secondary infection.  Apply this 3 times daily.  Signs and symptoms of infection were outlined in detail to the daughter and the patient.  Should any these occur they should return to our clinic or go to the emergency room immediately.        Final Clinical Impressions(s) / UC Diagnoses   Final diagnoses:  Blood  blister     Discharge Instructions     Apply mupirocin ointment 3 times daily to the area.  Avoid any extra pressure on the area.  If the pain increases or she has more swelling of the toe, if it becomes red and hot ,or if she has red streaks up her foot or leg ,return to our clinic immediately or go to the emergency room   ED Prescriptions    Medication Sig Dispense Auth. Provider   mupirocin ointment (BACTROBAN) 2 % Apply 1 application topically 3 (three) times daily. 22 g Lutricia Feil, PA-C     Controlled Substance Prescriptions [place] Controlled Substance Registry consulted? Not Applicable   Lutricia Feil, PA-C 10/19/17 1659

## 2017-10-19 NOTE — ED Triage Notes (Signed)
Pt c/o right great toe pain and has a black blister on the bottom of her toe. Her daughter in law noticed it this morning. Pt has dementia and does not remember any injury.

## 2018-09-23 IMAGING — CT CT HEAD W/O CM
3 series · 16 of 46 positions shown, 19 images · non-contrast
Comparison: None.

CLINICAL DATA: 85-year-old female with dementia, dizziness and
headaches. Initial encounter.

EXAM:
CT HEAD WITHOUT CONTRAST
TECHNIQUE: Contiguous axial images were obtained from the base of the skull
through the vertex without intravenous contrast.

[Series 2: head wo · axial · 0.38mm/px · z∈[-43,+77]mm · 10 of 29 slices shown, 13 images]
[im 3/29  brain]
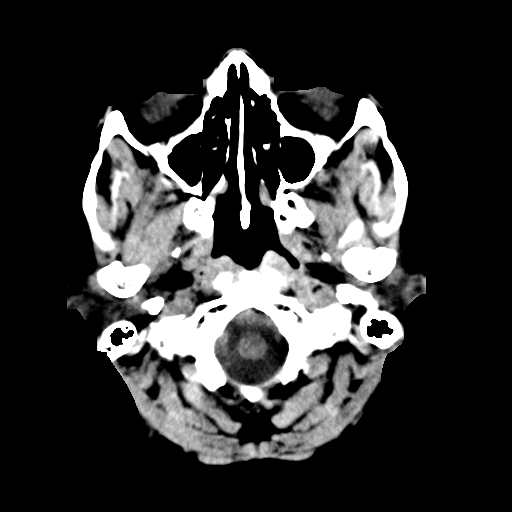
[im 3/29  bone]
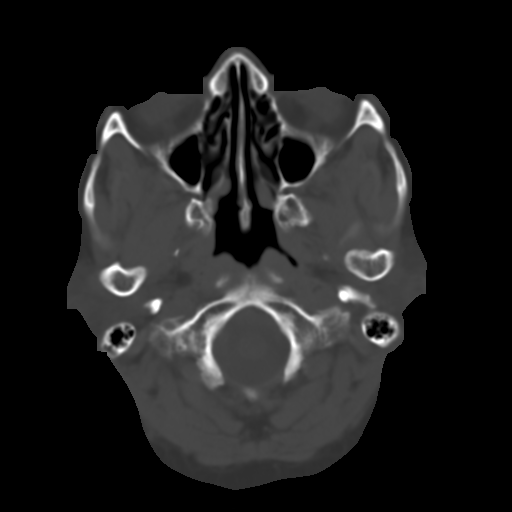
[im 6/29  brain]
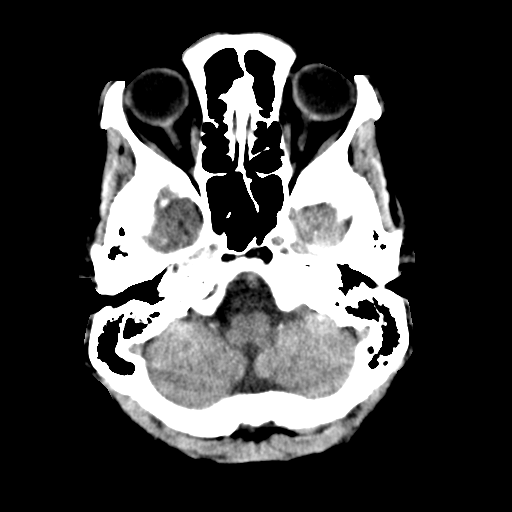
[im 8/29  brain]
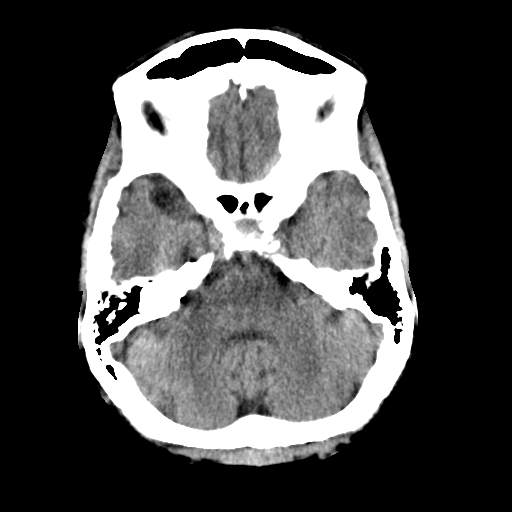
[im 11/29  brain]
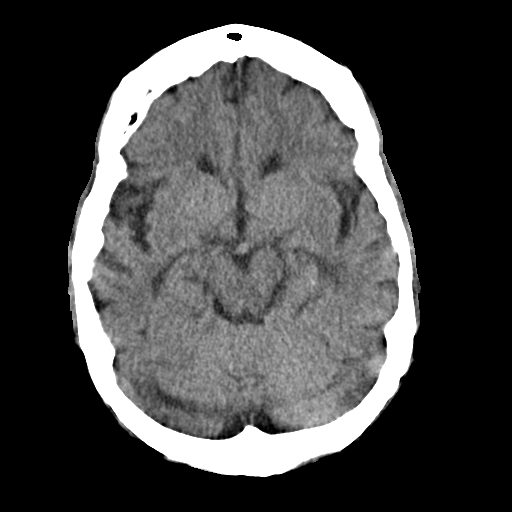
[im 14/29  brain]
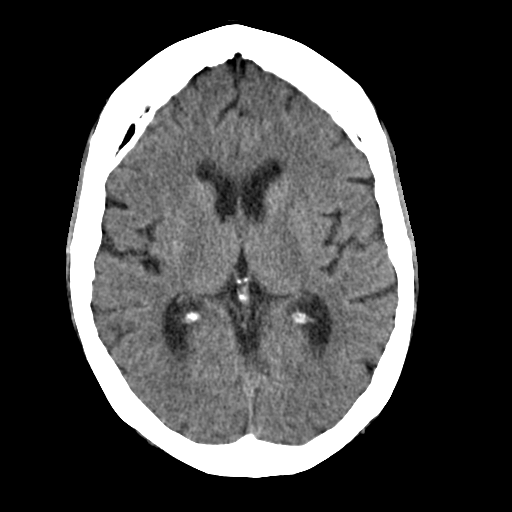
[im 14/29  bone]
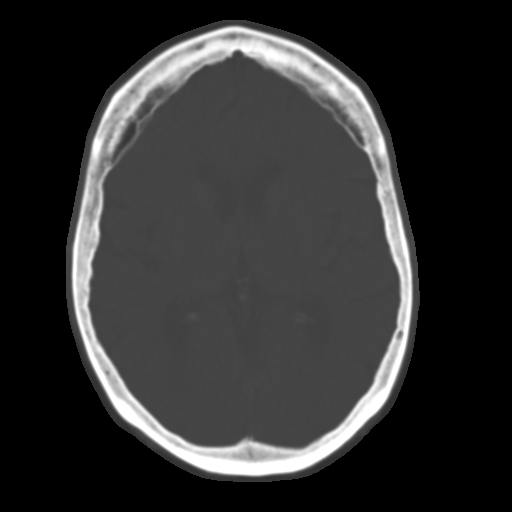
[im 16/29  brain]
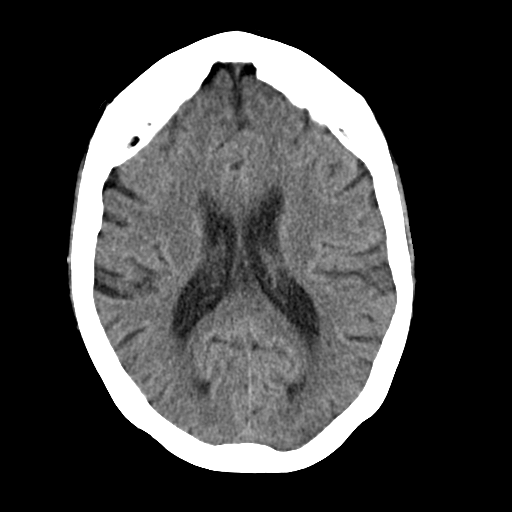
[im 19/29  brain]
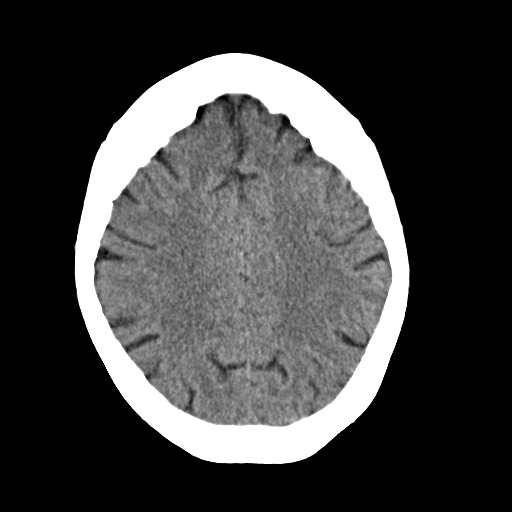
[im 22/29  brain]
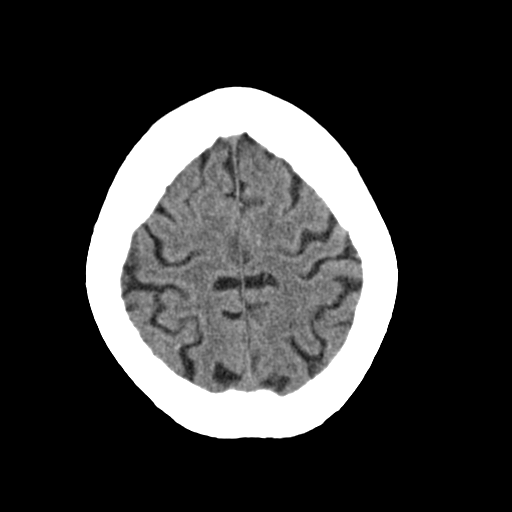
[im 24/29  brain]
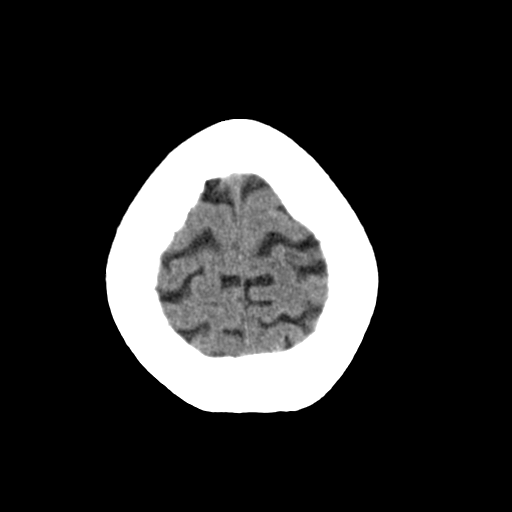
[im 24/29  bone]
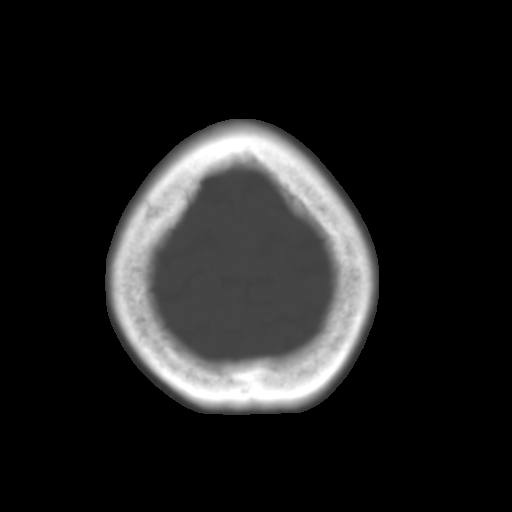
[im 27/29  brain]
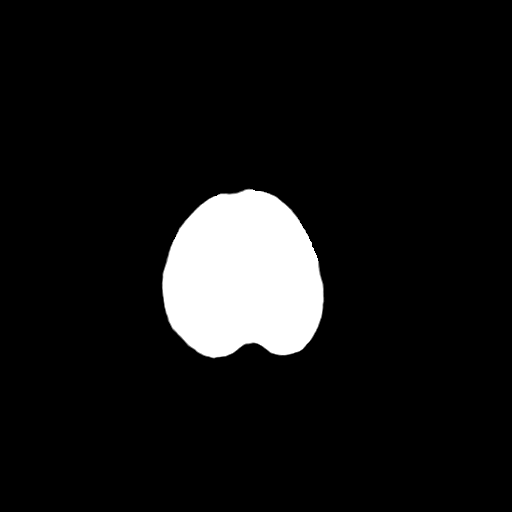

[Series 602: coronal · coronal · 0.38mm/px · 3 of 59 slices shown]
[im 20/59  brain]
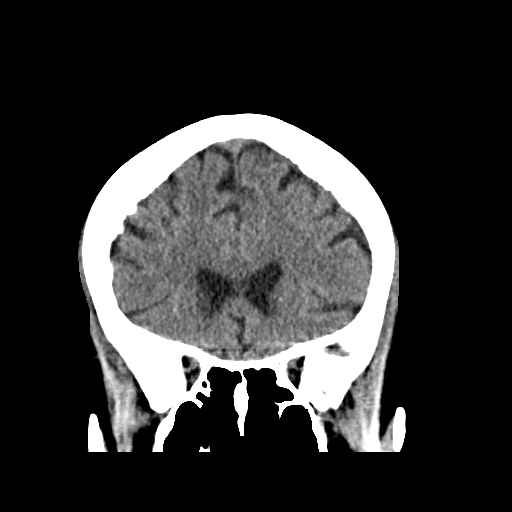
[im 26/59  brain]
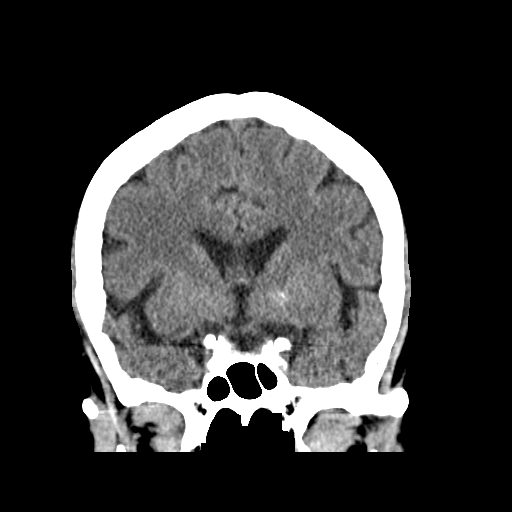
[im 33/59  brain]
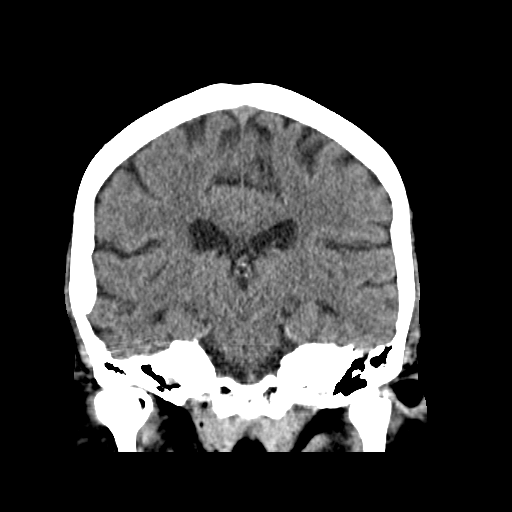

[Series 603: sagittal · sagittal · 0.38mm/px · 3 of 73 slices shown]
[im 25/73  brain]
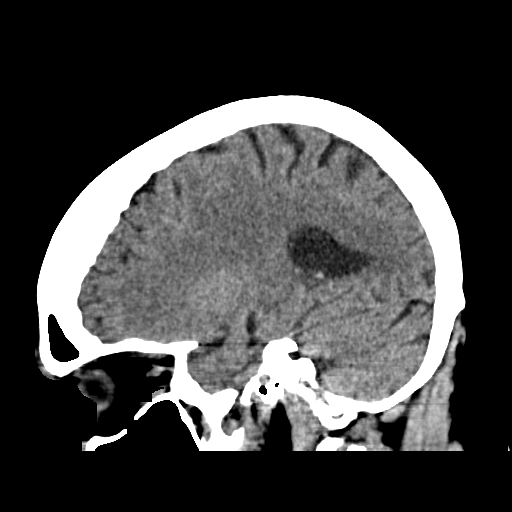
[im 37/73  brain]
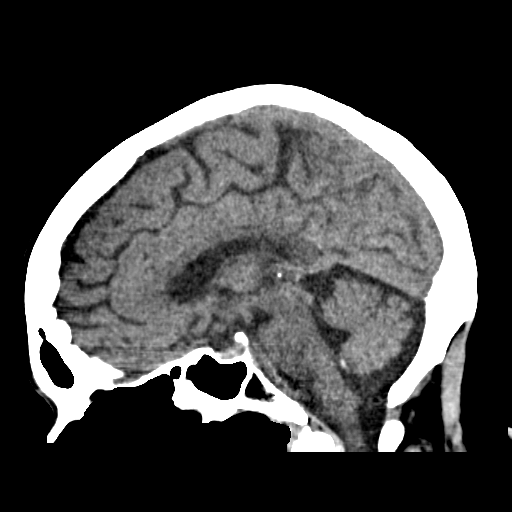
[im 49/73  brain]
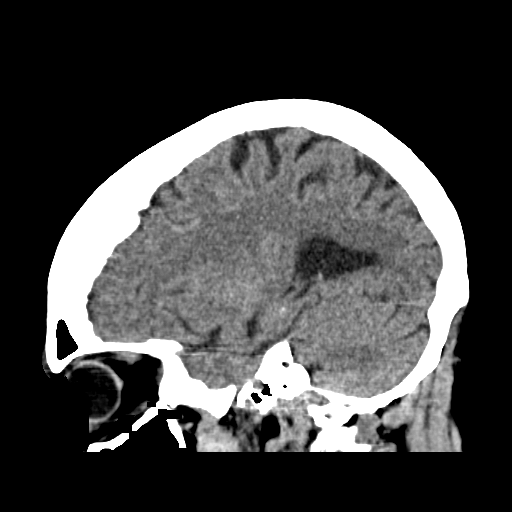

[16 of 46 positions shown; findings below may reference images not displayed]

FINDINGS: Brain: No intracranial hemorrhage or CT evidence of large acute
infarct.

Mild chronic microvascular changes.

Mild global atrophy without hydrocephalus.

No intracranial mass lesion noted on this unenhanced exam.

Prominent mineralization globus pallidus incidentally noted.

Vascular: Vascular calcifications.

Skull: Hyperostosis frontalis interna.

Sinuses/Orbits: No acute orbital abnormality. Post lens replacement.
Visualized paranasal sinuses and mastoid air cells are clear.

Other: Negative
IMPRESSION: Mild global atrophy.

Mild chronic microvascular changes.

No acute intracranial abnormality.

## 2019-01-15 ENCOUNTER — Ambulatory Visit: Payer: Medicare HMO | Attending: Internal Medicine

## 2019-01-15 DIAGNOSIS — Z20822 Contact with and (suspected) exposure to covid-19: Secondary | ICD-10-CM

## 2019-01-16 LAB — NOVEL CORONAVIRUS, NAA: SARS-CoV-2, NAA: NOT DETECTED

## 2019-01-24 ENCOUNTER — Ambulatory Visit
Admission: EM | Admit: 2019-01-24 | Discharge: 2019-01-24 | Disposition: A | Discharge status: Home / Self Care | Payer: Medicare HMO | Attending: Family Medicine | Admitting: Family Medicine

## 2019-01-24 ENCOUNTER — Other Ambulatory Visit: Payer: Self-pay

## 2019-01-24 DIAGNOSIS — Z20822 Contact with and (suspected) exposure to covid-19: Secondary | ICD-10-CM

## 2019-01-24 DIAGNOSIS — U071 COVID-19: Secondary | ICD-10-CM | POA: Insufficient documentation

## 2019-01-24 NOTE — ED Triage Notes (Signed)
Pt presents with son for COVID exposure. Pt's son states he and his wife were both positive for COVID and have completed their quarantine. He would like to have his mother tested for COVID. He states pt has not had any symptoms recently.

## 2019-01-24 NOTE — ED Provider Notes (Signed)
[facility] ____________________________________________  Time seen: Approximately 12:53 PM  I have reviewed the triage vital signs and the nursing notes.   HISTORY  Chief Complaint COVID exposure  Language barrier: Spanish.  Son at bedside.  HPI Eileen Mahoney is a 86 y.o. female past medical history of type 2 diabetes and dementia presenting with her son at bedside who is providing translation as well as history due to patient's dementia, declined translator, presenting for COVID-19 testing.  Son reports that he and his wife had COVID-19 within the last 2 weeks and has just completed their quarantine time.  States he wanted to bring his mom in for testing to make sure she does not have COVID-19.  Reports she has a chronic intermittent cough without any acute changes or worsening.  Denies nasal congestion, sore throat, chest pain, shortness of breath, vomiting or diarrhea.  Reports no change in her baseline appetite.  Patient at this time denies any pain, chest pain or shortness of breath.  Denies alleviating factors.  Denies other complaints.  Care, [place] [facility]: PCP    Past Medical History:  Diagnosis Date  . Dementia ([place])   . Diabetes mellitus without complication ([place])   . Hyperlipidemia   . Stroke [facility])     There are no problems to display for this patient.   Past Surgical History:  Procedure Laterality Date  . ABDOMINAL HYSTERECTOMY    . CHOLECYSTECTOMY    . EYE SURGERY       No current facility-administered medications for this encounter.  Current Outpatient Medications:  .  aspirin EC 81 MG tablet, Take 81 mg by mouth daily., Disp: , Rfl:  .  Calcium Carb-Ergocalciferol 500-200 MG-UNIT TABS, Take by mouth., Disp: , Rfl:  .  Cholecalciferol (VITAMIN D3) 2000 units TABS, Take 1 tablet by mouth daily with breakfast., Disp: , Rfl:  .  denosumab (PROLIA) 60 MG/ML SOLN injection, Prolia 60 mg/mL subcutaneous syringe, Disp: , Rfl:  .  donepezil  (ARICEPT) 5 MG tablet, Take 1 tablet (5 mg total) by mouth daily with breakfast., Disp: 30 tablet, Rfl: 0 .  mupirocin ointment (BACTROBAN) 2 %, Apply 1 application topically 3 (three) times daily., Disp: 22 g, Rfl: 0 .  NOVOLOG MIX 70/30 FLEXPEN (70-30) 100 UNIT/ML FlexPen, INJECT [place] 24 UNITS WITH BREAKFAST  AND 13 UNITS WITH DINNER OR AS DIRECTED BY ENDO NURSE, Disp: , Rfl: 4 .  pravastatin (PRAVACHOL) 40 MG tablet, Take 40 mg by mouth at bedtime., Disp: , Rfl: 0 .  QUEtiapine (SEROQUEL) 25 MG tablet, Take 25 mg by mouth daily., Disp: , Rfl: 3  Allergies Patient has no known allergies.  History reviewed. No pertinent family history.  Social History Social History   Tobacco Use  . Smoking status: Never Smoker  . Smokeless tobacco: Never Used  Substance Use Topics  . Alcohol use: No  . Drug use: Never    Review of Systems Constitutional: No fever ENT: No sore throat. Cardiovascular: Denies chest pain. Respiratory: Denies shortness of breath. Gastrointestinal: No abdominal pain.  No nausea, no vomiting.  No diarrhea. Musculoskeletal: Negative for back pain. Skin: Negative for rash.  ____________________________________________   PHYSICAL EXAM:  VITAL SIGNS: ED Triage Vitals  Enc Vitals Group     BP 01/24/19 1234 124/71     Pulse Rate 01/24/19 1234 85     Resp -- 18     Temp 01/24/19 1234 99.1 F (37.3 C)     Temp Source 01/24/19 1234  Oral     SpO2 01/24/19 1234 96 %     Weight 01/24/19 1230 168 lb (76.2 kg)     Height 01/24/19 1230 5\' 3"  (1.6 m)     Head Circumference --      Peak Flow --      Pain Score 01/24/19 1230 0     Pain Loc --      Pain Edu? --      Excl. in GC? --     Constitutional: Alert. Well appearing and in no acute distress. Eyes: Conjunctivae are normal.  ENT      Head: Normocephalic       Nose: No congestion      Mouth/Throat: Mucous membranes are moist.Oropharynx non-erythematous. Cardiovascular: Normal rate, regular rhythm. Grossly  normal heart sounds.  Good peripheral circulation. Respiratory: Normal respiratory effort without tachypnea nor retractions. Breath sounds are clear and equal bilaterally. No wheezes, rales, rhonchi. Musculoskeletal: Ambulatory in room. Skin:  Skin is warm, dry.    ___________________________________________   LABS (all labs ordered are listed, but only abnormal results are displayed)  Labs Reviewed  NOVEL CORONAVIRUS, NAA (HOSP ORDER, SEND-OUT TO REF LAB; TAT 18-24 HRS)    PROCEDURES Procedures   INITIAL IMPRESSION / ASSESSMENT AND PLAN / ED COURSE  Pertinent labs & imaging results that were available during my care of the patient were reviewed by me and considered in my medical decision making (see chart for details).  Well-appearing patient.  Son at bedside.  Reports mother is at her current baseline and request COVID-19 testing due to exposure from him and his family.  Patient denies complaints.  COVID-19 testing completed and advice given.  Monitor.  Discussed follow up and return parameters including no resolution or any worsening concerns. Patient verbalized understanding and agreed to plan.   ____________________________________________   FINAL CLINICAL IMPRESSION(S) / ED DIAGNOSES  Final diagnoses:  Encounter for screening laboratory testing for COVID-19 virus     ED Discharge Orders    None       Note: This dictation was prepared with Dragon dictation along with smaller phrase technology. Any transcriptional errors that result from this process are unintentional.         03/24/19, NP 01/24/19 1437

## 2019-01-25 ENCOUNTER — Telehealth (HOSPITAL_COMMUNITY): Payer: Self-pay | Admitting: Emergency Medicine

## 2019-01-25 NOTE — Telephone Encounter (Signed)
Your test for COVID-19 was positive, meaning that you were infected with the novel coronavirus and could give the germ to others.  Please continue isolation at home for at least 10 days since the start of your symptoms. If you do not have symptoms, please isolate at home for 10 days from the day you were tested. Once you complete your 10 day quarantine, you may return to normal activities as long as you've not had a fever for over 24 hours(without taking fever reducing medicine) and your symptoms are improving. Please continue good preventive care measures, including:  frequent hand-washing, avoid touching your face, cover coughs/sneezes, stay out of crowds and keep a 6 foot distance from others.  Go to the nearest hospital emergency room if fever/cough/breathlessness are severe or illness seems like a threat to life.  Family contacted with spanish interpreter. ALl questions answered.

## 2019-01-29 LAB — NOVEL CORONAVIRUS, NAA (HOSP ORDER, SEND-OUT TO REF LAB; TAT 18-24 HRS): SARS-CoV-2, NAA: DETECTED — AB

## 2021-11-16 DEATH — deceased
